# Patient Record
Sex: Male | Born: 1990 | Race: White | Hispanic: No | Marital: Single | Smoking: Current every day smoker
Health system: Southern US, Community
[De-identification: ages and names within clinical notes are randomized; demographics above are authoritative.]

## PROBLEM LIST (undated history)

## (undated) DIAGNOSIS — J939 Pneumothorax, unspecified: Secondary | ICD-10-CM

## (undated) DIAGNOSIS — R0789 Other chest pain: Secondary | ICD-10-CM

## (undated) DIAGNOSIS — G8929 Other chronic pain: Secondary | ICD-10-CM

## (undated) DIAGNOSIS — J982 Interstitial emphysema: Secondary | ICD-10-CM

---
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
## 2009-05-19 ENCOUNTER — Emergency Department (HOSPITAL_COMMUNITY): Admission: EM | Admit: 2009-05-19 | Discharge: 2009-05-19 | Payer: Self-pay | Admitting: Emergency Medicine

## 2009-08-13 DIAGNOSIS — J982 Interstitial emphysema: Secondary | ICD-10-CM

## 2009-08-13 DIAGNOSIS — J939 Pneumothorax, unspecified: Secondary | ICD-10-CM

## 2009-08-13 HISTORY — DX: Interstitial emphysema: J98.2

## 2009-08-13 HISTORY — DX: Pneumothorax, unspecified: J93.9

## 2009-09-14 ENCOUNTER — Emergency Department (HOSPITAL_COMMUNITY): Admission: EM | Admit: 2009-09-14 | Discharge: 2009-09-14 | Payer: Self-pay | Admitting: Emergency Medicine

## 2009-10-12 ENCOUNTER — Emergency Department (HOSPITAL_COMMUNITY): Admission: EM | Admit: 2009-10-12 | Discharge: 2009-10-12 | Payer: Self-pay | Admitting: Emergency Medicine

## 2010-01-19 ENCOUNTER — Encounter: Payer: Self-pay | Admitting: Emergency Medicine

## 2010-01-19 ENCOUNTER — Inpatient Hospital Stay (HOSPITAL_COMMUNITY): Admission: EM | Admit: 2010-01-19 | Discharge: 2010-01-21 | Payer: Self-pay | Admitting: Emergency Medicine

## 2010-10-30 LAB — CBC
HCT: 41.8 % (ref 39.0–52.0)
Hemoglobin: 13.7 g/dL (ref 13.0–17.0)
Hemoglobin: 13.7 g/dL (ref 13.0–17.0)
MCV: 92.9 fL (ref 78.0–100.0)
RBC: 4.29 MIL/uL (ref 4.22–5.81)
RBC: 4.31 MIL/uL (ref 4.22–5.81)
RDW: 13.2 % (ref 11.5–15.5)
WBC: 6.8 10*3/uL (ref 4.0–10.5)

## 2010-10-30 LAB — DIFFERENTIAL
Basophils Absolute: 0 10*3/uL (ref 0.0–0.1)
Basophils Relative: 0 % (ref 0–1)
Lymphs Abs: 1.1 10*3/uL (ref 0.7–4.0)
Monocytes Relative: 4 % (ref 3–12)
Neutrophils Relative %: 87 % — ABNORMAL HIGH (ref 43–77)

## 2010-10-30 LAB — BASIC METABOLIC PANEL
BUN: 6 mg/dL (ref 6–23)
CO2: 26 mEq/L (ref 19–32)
CO2: 31 mEq/L (ref 19–32)
Calcium: 9.5 mg/dL (ref 8.4–10.5)
Chloride: 109 mEq/L (ref 96–112)
Creatinine, Ser: 1.09 mg/dL (ref 0.4–1.5)
Creatinine, Ser: 0.89 mg/dL (ref 0.4–1.5)
GFR calc Af Amer: >60 mL/min (ref >60)
GFR calc Af Amer: >60 mL/min (ref >60)
GFR calc Af Amer: >60 mL/min (ref >60)
GFR calc non Af Amer: >60 mL/min (ref >60)
Glucose, Bld: 101 mg/dL — ABNORMAL HIGH (ref 70–99)
Potassium: 3.6 mEq/L (ref 3.5–5.1)
Sodium: 138 mEq/L (ref 135–145)

## 2010-10-30 LAB — GLUCOSE, CAPILLARY: Glucose-Capillary: 107 mg/dL — ABNORMAL HIGH (ref 70–99)

## 2010-11-01 LAB — RAPID STREP SCREEN (MED CTR MEBANE ONLY): Streptococcus, Group A Screen (Direct): NEGATIVE

## 2010-11-06 LAB — RAPID STREP SCREEN (MED CTR MEBANE ONLY): Streptococcus, Group A Screen (Direct): NEGATIVE

## 2013-12-08 DIAGNOSIS — F17200 Nicotine dependence, unspecified, uncomplicated: Secondary | ICD-10-CM | POA: Insufficient documentation

## 2013-12-08 DIAGNOSIS — H53149 Visual discomfort, unspecified: Secondary | ICD-10-CM | POA: Insufficient documentation

## 2013-12-08 DIAGNOSIS — H5710 Ocular pain, unspecified eye: Secondary | ICD-10-CM | POA: Insufficient documentation

## 2013-12-08 DIAGNOSIS — H5789 Other specified disorders of eye and adnexa: Secondary | ICD-10-CM | POA: Insufficient documentation

## 2013-12-09 ENCOUNTER — Encounter (HOSPITAL_COMMUNITY): Payer: Self-pay | Admitting: Emergency Medicine

## 2013-12-09 ENCOUNTER — Emergency Department (HOSPITAL_COMMUNITY)
Admission: EM | Admit: 2013-12-09 | Discharge: 2013-12-09 | Disposition: A | Discharge status: Home / Self Care | Payer: Self-pay | Attending: Emergency Medicine | Admitting: Emergency Medicine

## 2013-12-09 DIAGNOSIS — H5712 Ocular pain, left eye: Secondary | ICD-10-CM

## 2013-12-09 MED ORDER — FLUORESCEIN SODIUM 1 MG OP STRP
ORAL_STRIP | OPHTHALMIC | Status: AC
  Administered 2013-12-09: 01:00:00
  Filled 2013-12-09: qty 1

## 2013-12-09 MED ORDER — TETRACAINE HCL 0.5 % OP SOLN
OPHTHALMIC | Status: AC
  Administered 2013-12-09: 01:00:00
  Filled 2013-12-09: qty 2

## 2013-12-09 MED ORDER — ERYTHROMYCIN 5 MG/GM OP OINT
TOPICAL_OINTMENT | Freq: Once | OPHTHALMIC | Status: AC
  Administered 2013-12-09: 1 via OPHTHALMIC
  Filled 2013-12-09: qty 3.5

## 2013-12-09 NOTE — ED Provider Notes (Signed)
Medical screening examination/treatment/procedure(s) were performed by non-physician practitioner and as supervising physician I was immediately available for consultation/collaboration.    Vida RollerBrian D Douglass Dunshee, MD 12/09/13 [phone]

## 2013-12-09 NOTE — Discharge Instructions (Signed)
Apply a small ribbon (1/2 inch) of the antibiotic ointment to your left lower eyelid edge.  Blinking will distribute it across your eye and help soothe it.  It will cause blurred vision for a few minutes and is normal .

## 2013-12-09 NOTE — ED Provider Notes (Signed)
CSN: [id number]     Arrival date & time 12/08/13  2353 History   First MD Initiated Contact with Patient 12/09/13 0018     Chief Complaint  Patient presents with  . Eye Injury     (Consider location/radiation/quality/duration/timing/severity/associated sxs/prior Treatment) HPI Comments: Mark Andersen W Savin is a 23 y.o. Male presenting with a foreign body sensation of his left eye which he woke with today.  He was standing near someone sawing wood yesterday when a small piece of wood his his left eye.  He denies having pain in the eye until waking today with sensation of foreign body under his left upper eyelid along with light sensitivity.  He denies visual changes and there has been no drainage from the eye.  He has taken no medicines prior to arriva..     Patient is a 23 y.o. male presenting with eye injury. The history is provided by the patient.  Eye Injury This is a new problem. The current episode started yesterday. The problem occurs constantly. The problem has been unchanged. Pertinent negatives include no chest pain, congestion, fever, headaches, nausea, neck pain, numbness, rash, sore throat, visual change or weakness. Exacerbated by: blinking. He has tried nothing for the symptoms.    History reviewed. No pertinent past medical history. History reviewed. No pertinent past surgical history. History reviewed. No pertinent family history. History  Substance Use Topics  . Smoking status: Current Every Day Smoker -- 0.50 packs/day for 5 years    Types: Cigarettes  . Smokeless tobacco: Not on file  . Alcohol Use: No    Review of Systems  Constitutional: Negative for fever.  HENT: Negative for congestion and sore throat.   Eyes: Positive for photophobia, pain and redness. Negative for discharge and visual disturbance.  Respiratory: Negative for chest tightness and shortness of breath.   Cardiovascular: Negative for chest pain.  Gastrointestinal: Negative for nausea.   Genitourinary: Negative.   Musculoskeletal: Negative for neck pain.  Skin: Negative.  Negative for rash and wound.  Neurological: Negative for dizziness, weakness, light-headedness, numbness and headaches.  Psychiatric/Behavioral: Negative.       Allergies  Review of patient's allergies indicates no known allergies.  Home Medications   Prior to Admission medications   Not on File   BP 133/86  Pulse 85  Temp(Src) 97.5 F (36.4 C) (Oral)  Resp 20  Ht 6\' 1"  (1.854 m)  Wt 158 lb (71.668 kg)  BMI 20.85 kg/m2  SpO2 100% Physical Exam  Nursing note and vitals reviewed. Constitutional: He appears well-developed and well-nourished. No distress.  HENT:  Head: Normocephalic and atraumatic.  Eyes: EOM are normal. Pupils are equal, round, and reactive to light. Lids are everted and swept, no foreign bodies found. Left eye exhibits no chemosis and no discharge. No foreign body present in the left eye. Left conjunctiva is injected.  Slit lamp exam:      The left eye shows no corneal abrasion, no corneal flare, no corneal ulcer, no foreign body, no hyphema, no fluorescein uptake and no anterior chamber bulge.  Visual Acuity - R Near: 20/25 ; L Near: 20/25   Pt does appreciate burning type pain only at the 12 oclock position with consensual light stimulus to right eye.          Neck: Normal range of motion.  Cardiovascular: Normal rate, regular rhythm, normal heart sounds and intact distal pulses.   Pulmonary/Chest: Effort normal and breath sounds normal. He has no wheezes.  Abdominal: Soft.  Bowel sounds are normal. There is no tenderness.  Musculoskeletal: Normal range of motion.  Neurological: He is alert.  Skin: Skin is warm and dry.  Psychiatric: He has a normal mood and affect.    ED Course  Procedures (including critical care time) Labs Review Labs Reviewed - No data to display  Imaging Review No results found.   EKG Interpretation None      MDM   Final  diagnoses:  Pain, eye, left    Pt discussed with Dr. Hyacinth MeekerMiller prior to dc home.  Pt without exam findings to explain his pain tonight.  His eye was flushed with NS and he had no improvement in sx.  He was referred to Dr Lita MainsHaines for further evaluation tomorrow.  In the interim,  Applied and gave tobrex ointment for abx coverage and for tx of discomfort.  Burgess AmorJulie Xavian Hardcastle, PA-C 12/09/13 0105

## 2013-12-09 NOTE — ED Notes (Signed)
F.B sensation after being near a saw cutting wood

## 2014-07-22 ENCOUNTER — Encounter (HOSPITAL_COMMUNITY): Payer: Self-pay | Admitting: Emergency Medicine

## 2014-07-22 ENCOUNTER — Emergency Department (HOSPITAL_COMMUNITY): Payer: Self-pay

## 2014-07-22 ENCOUNTER — Emergency Department (HOSPITAL_COMMUNITY)
Admission: EM | Admit: 2014-07-22 | Discharge: 2014-07-22 | Disposition: A | Discharge status: Home / Self Care | Payer: Self-pay | Attending: Emergency Medicine | Admitting: Emergency Medicine

## 2014-07-22 DIAGNOSIS — Z8709 Personal history of other diseases of the respiratory system: Secondary | ICD-10-CM | POA: Insufficient documentation

## 2014-07-22 DIAGNOSIS — G8929 Other chronic pain: Secondary | ICD-10-CM | POA: Insufficient documentation

## 2014-07-22 DIAGNOSIS — Z720 Tobacco use: Secondary | ICD-10-CM | POA: Insufficient documentation

## 2014-07-22 DIAGNOSIS — R0789 Other chest pain: Secondary | ICD-10-CM | POA: Insufficient documentation

## 2014-07-22 DIAGNOSIS — R52 Pain, unspecified: Secondary | ICD-10-CM

## 2014-07-22 HISTORY — DX: Other chronic pain: G89.29

## 2014-07-22 HISTORY — DX: Interstitial emphysema: J98.2

## 2014-07-22 HISTORY — DX: Other chest pain: R07.89

## 2014-07-22 HISTORY — DX: Pneumothorax, unspecified: J93.9

## 2014-07-22 LAB — CBC WITH DIFFERENTIAL/PLATELET
BASOS PCT: 0 % (ref 0–1)
Basophils Absolute: 0 10*3/uL (ref 0.0–0.1)
Eosinophils Absolute: 0.1 10*3/uL (ref 0.0–0.7)
Eosinophils Relative: 1 % (ref 0–5)
HEMATOCRIT: 40.5 % (ref 39.0–52.0)
Hemoglobin: 14.5 g/dL (ref 13.0–17.0)
LYMPHS PCT: 25 % (ref 12–46)
Lymphs Abs: 1.4 10*3/uL (ref 0.7–4.0)
MCH: 32.1 pg (ref 26.0–34.0)
MCHC: 35.8 g/dL (ref 30.0–36.0)
MCV: 89.6 fL (ref 78.0–100.0)
MONO ABS: 0.4 10*3/uL (ref 0.1–1.0)
Monocytes Relative: 7 % (ref 3–12)
NEUTROS ABS: 3.8 10*3/uL (ref 1.7–7.7)
NEUTROS PCT: 67 % (ref 43–77)
Platelets: 188 10*3/uL (ref 150–400)
RBC: 4.52 MIL/uL (ref 4.22–5.81)
RDW: 12.2 % (ref 11.5–15.5)
WBC: 5.7 10*3/uL (ref 4.0–10.5)

## 2014-07-22 LAB — BASIC METABOLIC PANEL
ANION GAP: 11 (ref 5–15)
BUN: 11 mg/dL (ref 6–23)
CHLORIDE: 100 meq/L (ref 96–112)
CO2: 29 meq/L (ref 19–32)
CREATININE: 1.04 mg/dL (ref 0.50–1.35)
Calcium: 9.7 mg/dL (ref 8.4–10.5)
GFR calc non Af Amer: >90 mL/min (ref >90)
Glucose, Bld: 98 mg/dL (ref 70–99)
POTASSIUM: 4.7 meq/L (ref 3.7–5.3)
Sodium: 140 mEq/L (ref 137–147)

## 2014-07-22 LAB — D-DIMER, QUANTITATIVE (NOT AT ARMC)

## 2014-07-22 LAB — TROPONIN I: Troponin I: <0.3 ng/mL (ref <0.30)

## 2014-07-22 LAB — PRO B NATRIURETIC PEPTIDE: PRO B NATRI PEPTIDE: 23.5 pg/mL (ref 0–125)

## 2014-07-22 MED ORDER — IBUPROFEN 800 MG PO TABS: 800.0000 mg | ORAL_TABLET | Freq: Three times a day (TID) | ORAL | Status: DC

## 2014-07-22 MED ORDER — TRAMADOL HCL 50 MG PO TABS: 50.0000 mg | ORAL_TABLET | Freq: Four times a day (QID) | ORAL | Status: DC | PRN

## 2014-07-22 NOTE — ED Provider Notes (Signed)
CSN: [id number]     Arrival date & time 07/22/14  1556 History   First MD Initiated Contact with Patient 07/22/14 1619     Chief Complaint  Patient presents with  . Pleurisy     (Consider location/radiation/quality/duration/timing/severity/associated sxs/prior Treatment) HPI Comments: Patient presents to the ER for evaluation of intermittent episodes of chest pain and/or palpitations over the last 2 or 3 weeks. Patient reports that he has been experiencing intermittent episodes of feeling like his heart is fluttering and it makes him feel like he can't catch his breath. He has now developed a sharp pain that feels like "a needle" to the left side of his sternum that occurs intermittently. This seems to worsen if he presses on the area or makes certain movements with his torso. Patient does report that he had a history of pneumothorax 5 years ago and has had some episodes of chronic pain ever since.   Past Medical History  Diagnosis Date  . Chronic chest wall pain   . Pneumothorax 2011    bilateral  . Pneumomediastinum 2011   History reviewed. No pertinent past surgical history. No family history on file. History  Substance Use Topics  . Smoking status: Current Every Day Smoker -- 0.50 packs/day for 5 years    Types: Cigarettes  . Smokeless tobacco: Not on file  . Alcohol Use: No    Review of Systems  Respiratory: Positive for shortness of breath.   Cardiovascular: Positive for chest pain.  All other systems reviewed and are negative.     Allergies  Review of patient's allergies indicates no known allergies.  Home Medications   Prior to Admission medications   Not on File   BP 124/66 mmHg  Pulse 73  Temp(Src) 98.8 F (37.1 C)  Resp 14  Ht 6\' 1"  (1.854 m)  Wt 158 lb (71.668 kg)  BMI 20.85 kg/m2  SpO2 98% Physical Exam  Constitutional: He is oriented to person, place, and time. He appears well-developed and well-nourished. No distress.  HENT:  Head:  Normocephalic and atraumatic.  Right Ear: Hearing normal.  Left Ear: Hearing normal.  Nose: Nose normal.  Mouth/Throat: Oropharynx is clear and moist and mucous membranes are normal.  Eyes: Conjunctivae and EOM are normal. Pupils are equal, round, and reactive to light.  Neck: Normal range of motion. Neck supple.  Cardiovascular: Regular rhythm, S1 normal and S2 normal.  Exam reveals no gallop and no friction rub.   No murmur heard. Pulmonary/Chest: Effort normal and breath sounds normal. No respiratory distress. He exhibits tenderness.    Abdominal: Soft. Normal appearance and bowel sounds are normal. There is no hepatosplenomegaly. There is no tenderness. There is no rebound, no guarding, no tenderness at McBurney's point and negative Murphy's sign. No hernia.  Musculoskeletal: Normal range of motion.  Neurological: He is alert and oriented to person, place, and time. He has normal strength. No cranial nerve deficit or sensory deficit. Coordination normal. GCS eye subscore is 4. GCS verbal subscore is 5. GCS motor subscore is 6.  Skin: Skin is warm, dry and intact. No rash noted. No cyanosis.  Psychiatric: He has a normal mood and affect. His speech is normal and behavior is normal. Thought content normal.  Nursing note and vitals reviewed.   ED Course  Procedures (including critical care time) Labs Review Labs Reviewed  CBC WITH DIFFERENTIAL  BASIC METABOLIC PANEL  TROPONIN I  PRO B NATRIURETIC PEPTIDE  D-DIMER, QUANTITATIVE    Imaging Review Dg  Chest 2 View  07/22/2014   CLINICAL DATA:  Midsternal chest pain. Pain with movement and positional change. Chronic symptoms following pneumothorax 5 years ago. Recent aggravation of symptoms.  EXAM: CHEST  2 VIEW  COMPARISON:  01/21/2010.  FINDINGS: There is no pneumothorax or pneumomediastinum. Bilateral pleural apical thickening is present compatible with scarring. Monitoring leads project over the chest. The cardiopericardial  silhouette is within normal limits. There is no airspace disease or effusion. The LEFT hemithorax is hyperlucent compared to the RIGHT. This is probably technical.  IMPRESSION: No active cardiopulmonary disease.   Electronically Signed   By: Andreas NewportGeoffrey  Lamke M.D.   On: 07/22/2014 16:37     EKG Interpretation None      MDM   Final diagnoses:  Pain  chest wall pain  Patient presents to the ER for evaluation of sharp pain to the left of the sternum. Region is tender and there is also worsening of pain with movements of the torso. This is consistent with musculoskeletal chest pain. Patient does have a history of spontaneous pneumothorax. X-ray today does not suggest pneumothorax. Lab work was otherwise unremarkable. D-dimer was also negative, very little concern for PE. Patient was reassured, likely musculoskeletal and/or inflammatory. With his history of pneumothorax, will have patient return if his symptoms worsen, as I cannot rule out very small pneumothorax not seen on x-ray, but symptoms are atypical and I do not think the patient requires a CT at this time.    Gilda Creasehristopher J. Pollina, MD 07/22/14 [phone]

## 2014-07-22 NOTE — Discharge Instructions (Signed)

## 2014-07-22 NOTE — ED Notes (Addendum)
Pt c/o midsternal c/p worse with movement and position change. States this has been a chronic issue since having a pneumothorax 5 years ago and has recently become more frequent.

## 2018-01-14 ENCOUNTER — Emergency Department (HOSPITAL_COMMUNITY)
Admission: EM | Admit: 2018-01-14 | Discharge: 2018-01-14 | Disposition: A | Discharge status: Home / Self Care | Payer: BLUE CROSS/BLUE SHIELD | Attending: Emergency Medicine | Admitting: Emergency Medicine

## 2018-01-14 ENCOUNTER — Other Ambulatory Visit: Payer: Self-pay

## 2018-01-14 ENCOUNTER — Encounter (HOSPITAL_COMMUNITY): Payer: Self-pay

## 2018-01-14 ENCOUNTER — Emergency Department (HOSPITAL_COMMUNITY): Payer: BLUE CROSS/BLUE SHIELD

## 2018-01-14 DIAGNOSIS — F17210 Nicotine dependence, cigarettes, uncomplicated: Secondary | ICD-10-CM | POA: Diagnosis not present

## 2018-01-14 DIAGNOSIS — Y998 Other external cause status: Secondary | ICD-10-CM | POA: Diagnosis not present

## 2018-01-14 DIAGNOSIS — S6992XA Unspecified injury of left wrist, hand and finger(s), initial encounter: Secondary | ICD-10-CM | POA: Diagnosis present

## 2018-01-14 DIAGNOSIS — Y9389 Activity, other specified: Secondary | ICD-10-CM | POA: Insufficient documentation

## 2018-01-14 DIAGNOSIS — S62317A Displaced fracture of base of fifth metacarpal bone. left hand, initial encounter for closed fracture: Secondary | ICD-10-CM | POA: Diagnosis not present

## 2018-01-14 DIAGNOSIS — W2209XA Striking against other stationary object, initial encounter: Secondary | ICD-10-CM | POA: Insufficient documentation

## 2018-01-14 DIAGNOSIS — Y929 Unspecified place or not applicable: Secondary | ICD-10-CM | POA: Diagnosis not present

## 2018-01-14 DIAGNOSIS — Z23 Encounter for immunization: Secondary | ICD-10-CM | POA: Insufficient documentation

## 2018-01-14 MED ORDER — BACITRACIN ZINC 500 UNIT/GM EX OINT
1.0000 "application " | TOPICAL_OINTMENT | Freq: Two times a day (BID) | CUTANEOUS | Status: DC
  Administered 2018-01-14: 1 via TOPICAL
  Filled 2018-01-14 (×2): qty 0.9

## 2018-01-14 MED ORDER — IBUPROFEN 800 MG PO TABS
800.0000 mg | ORAL_TABLET | Freq: Once | ORAL | Status: AC
Start: 2018-01-14 — End: 2018-01-14
  Administered 2018-01-14: 800 mg via ORAL
  Filled 2018-01-14: qty 1

## 2018-01-14 MED ORDER — TETANUS-DIPHTH-ACELL PERTUSSIS 5-2.5-18.5 LF-MCG/0.5 IM SUSP
0.5000 mL | Freq: Once | INTRAMUSCULAR | Status: AC
  Administered 2018-01-14: 0.5 mL via INTRAMUSCULAR
  Filled 2018-01-14: qty 0.5

## 2018-01-14 MED ORDER — IBUPROFEN 800 MG PO TABS: 800.0000 mg | ORAL_TABLET | Freq: Three times a day (TID) | ORAL | 0 refills | Status: AC

## 2018-01-14 NOTE — ED Provider Notes (Signed)
[facility] EMERGENCY DEPARTMENT Provider Note   CSN: [id number] Arrival date & time: 01/14/18  1004     History   Chief Complaint Chief Complaint  Patient presents with  . Hand Injury    HPI Mark Andersen is a 27 y.o. male.  HPI  The patient is a 27 year old male, he has a history of striking a tree with a closed fist of his left hand just prior to arrival when he became upset.  He had acute onset of pain and deformity of the left lateral hand.  He is right-hand dominant.  This was acute in onset, pain is persistent, worse with range of motion, associated with mild swelling but no numbness.  Past Medical History:  Diagnosis Date  . Chronic chest wall pain   . Pneumomediastinum (HCC) 2011  . Pneumothorax 2011   bilateral    There are no active problems to display for this patient.   History reviewed. No pertinent surgical history.      Home Medications    Prior to Admission medications   Medication Sig Start Date End Date Taking? Authorizing Provider  ibuprofen (ADVIL,MOTRIN) 800 MG tablet Take 1 tablet (800 mg total) by mouth 3 (three) times daily. 01/14/18   Eber Hong, MD    Family History No family history on file.  Social History Social History   Tobacco Use  . Smoking status: Current Every Day Smoker    Packs/day: 0.50    Years: 5.00    Pack years: 2.50    Types: Cigarettes  Substance Use Topics  . Alcohol use: No  . Drug use: Not on file     Allergies   Patient has no known allergies.   Review of Systems Review of Systems  Musculoskeletal: Positive for joint swelling.  Neurological: Negative for weakness.     Physical Exam Updated Vital Signs BP 126/78 (BP Location: Right Arm)   Pulse 63   Temp 98.2 F (36.8 C) (Oral)   Resp 18   Ht 6\' 2"  (1.88 m)   Wt 71.7 kg (158 lb)   SpO2 99%   BMI 20.29 kg/m   Physical Exam  Constitutional: He appears well-developed and well-nourished.  HENT:  Head: Normocephalic and atraumatic.    Eyes: Conjunctivae are normal. Right eye exhibits no discharge. Left eye exhibits no discharge.  Pulmonary/Chest: Effort normal. No respiratory distress.  Musculoskeletal: He exhibits tenderness and deformity.  Has ttp in the lateral left proximal fifth metacarpal.  Normal range of motion of the wrist, the patient is able to completely flex the hand though with some pain.  There is some abrasions over the knuckles  Neurological: He is alert. Coordination normal.  Normal strength and sensation of the hand  Skin: Skin is warm and dry. No rash noted. He is not diaphoretic. No erythema.  Psychiatric: He has a normal mood and affect.  Nursing note and vitals reviewed.    ED Treatments / Results  Labs (all labs ordered are listed, but only abnormal results are displayed) Labs Reviewed - No data to display  EKG None  Radiology Dg Hand Complete Left  Result Date: 01/14/2018 CLINICAL DATA:  Punched a tree this morning, pain and swelling EXAM: LEFT HAND - COMPLETE 3+ VIEW COMPARISON:  None FINDINGS: Osseous mineralization normal. Joint spaces preserved. Displaced fracture at base of LEFT fifth metacarpal. No definite intra-articular extension. Mild overlying soft tissue swelling. No additional fracture, dislocation, or bone destruction. IMPRESSION: Mildly displaced fracture at base of LEFT  fifth metacarpal. Electronically Signed   By: Ulyses SouthwardMark  Boles M.D.   On: 01/14/2018 10:32    Procedures Procedures (including critical care time)  Medications Ordered in ED Medications  Tdap (BOOSTRIX) injection 0.5 mL (has no administration in time range)  bacitracin ointment 1 application (has no administration in time range)     Initial Impression / Assessment and Plan / ED Course  I have reviewed the triage vital signs and the nursing notes.  Pertinent labs & imaging results that were available during my care of the patient were reviewed by me and considered in my medical decision making (see chart  for details).    I have personally looked at the x-ray, I believe that there is a proximal fifth metacarpal fracture.  It is minimally displaced, there is no signs of open fracture, he does have some abrasions, he does not know when his last tetanus shot was.  He will be placed in a ulnar gutter splint, I have arranged follow-up for orthopedics with his outpatient follow-up phone number which the patient can call to make an appointment.  He will be given some anti-inflammatories for pain, antibiotic ointment for his wounds, but this is not an open fracture, the patient is agreeable to the plan.  Final Clinical Impressions(s) / ED Diagnoses   Final diagnoses:  Closed displaced fracture of base of fifth metacarpal bone of left hand, initial encounter    ED Discharge Orders        Ordered    ibuprofen (ADVIL,MOTRIN) 800 MG tablet  3 times daily     01/14/18 1055       Eber HongMiller, Sherrel Shafer, MD 01/14/18 1055

## 2018-01-14 NOTE — Discharge Instructions (Signed)
Call for an appointment today.  You should be seen within the next week.  Please wear your splint until you are seen in follow-up.  You will not be able to use her hand until you are cleared by the orthopedic surgeon.  Take high-dose ibuprofen for pain and swelling, please ice and elevate your hand.

## 2018-01-14 NOTE — ED Triage Notes (Signed)
30-45 mins ago pt was upset and hit a tree with his left hand. Slight deformity noted to lateral part of hand and cut noted.

## 2018-01-16 ENCOUNTER — Encounter: Payer: Self-pay | Admitting: Orthopedic Surgery

## 2018-01-16 ENCOUNTER — Ambulatory Visit (INDEPENDENT_AMBULATORY_CARE_PROVIDER_SITE_OTHER): Payer: BLUE CROSS/BLUE SHIELD | Admitting: Orthopedic Surgery

## 2018-01-16 VITALS — BP 129/74 | HR 88 | Ht 74.0 in | Wt 150.0 lb

## 2018-01-16 DIAGNOSIS — S63055A Dislocation of other carpometacarpal joint of left hand, initial encounter: Secondary | ICD-10-CM | POA: Diagnosis not present

## 2018-01-16 NOTE — Patient Instructions (Signed)
NO WORK 6 WEEKS

## 2018-01-17 NOTE — Progress Notes (Signed)
  NEW PATIENT OFFICE VISIT   Chief Complaint  Patient presents with  . Hand Pain    [facility]     MEDICAL DECISION SECTION  xrays ordered? no  My independent reading of xrays: X-rays from the hospital show a [facility] with digit base   Encounter Diagnosis  Name Primary?  . CMC (carpometacarpal joint) dislocation, left, initial encounter, 5TH DIGIT  Yes     PLAN:  Short arm cast No work for 6 weeks  x-ray in 6 weeks   INJECTION  n0 No orders of the defined types were placed in this encounter.   FURTHER WORK UP PLANNED no     Chief Complaint  Patient presents with  . Hand Pain    [facility]    27 year old male presents with a right hand fracture  He says he fell became twisted somehow twisted and injured his right hand.  He complains of painful right hand for 2 days nonradiating dull in quality associated with swelling and difficulty bending his fingers  He says that he pulled it back into place when he hurt it   Review of Systems  Genitourinary: Negative.   Musculoskeletal: Negative.   Neurological: Negative.      Past Medical History:  Diagnosis Date  . Chronic chest wall pain   . Pneumomediastinum (HCC) 2011  . Pneumothorax 2011   bilateral    History reviewed. No pertinent surgical history.  Family History  Problem Relation Age of Onset  . Healthy Mother   . Diabetes Father    Social History   Tobacco Use  . Smoking status: Current Every Day Smoker    Packs/day: 0.50    Years: 5.00    Pack years: 2.50    Types: Cigarettes  . Smokeless tobacco: Never Used  Substance Use Topics  . Alcohol use: No  . Drug use: Not on file    @ALL @  No outpatient medications have been marked as taking for the 01/16/18 encounter (Office Visit) with Vickki HearingHarrison, Donnivan Villena E, MD.    BP 129/74   Pulse 88   Ht 6\' 2"  (1.88 m)   Wt 150 lb (68 kg)   BMI 19.26 kg/m   Physical Exam  Constitutional: He  is oriented to person, place, and time. He appears well-developed and well-nourished.  Vital signs have been reviewed and are stable. Gen. appearance the patient is well-developed and well-nourished with normal grooming and hygiene.   Neurological: He is alert and oriented to person, place, and time.  Skin: Skin is warm and dry. No erythema.  Psychiatric: He has a normal mood and affect.  Vitals reviewed.   Ortho Exam  Right hand skin some abrasions none near the fracture site primarily over the fingers.  He is tender at the [facility] at the base of the fifth metacarpal there is swelling there without deformity.  He has decreased range range of motion of the small ring and long finger thumb and index normal.  There does appear to be some subluxation of the [facility] joint but there is no dorsal dislocation.  He has decreased grip strength in his hand secondary to pain normal sensation is noted in all digits color and capillary refill normal with normal pulse and perfusion  His left hand for comparison no malalignment normal range of motion normal strength normal pulse normal sensation  Fuller CanadaStanley Maryiah Olvey, MD  01/17/2018 9:09 AM

## 2018-01-23 ENCOUNTER — Telehealth: Payer: Self-pay | Admitting: Orthopedic Surgery

## 2018-01-23 NOTE — Telephone Encounter (Signed)
Palmar edge of the cast was trimmed and moleskin placed, since cast was deficient in padding in the area. He was happy with cast repair and will let me know if he has any further problems.  To you FYI

## 2018-01-23 NOTE — Telephone Encounter (Signed)
Patient stopped by office primarily to ask about forms process for his FMLA and short-term disability paperwork for which we have advised patient; he then mentioned having gotten his cast a little wet. Forwarding note to clinical staff as no providers in office this afternoon.

## 2018-02-10 ENCOUNTER — Ambulatory Visit (INDEPENDENT_AMBULATORY_CARE_PROVIDER_SITE_OTHER): Payer: BLUE CROSS/BLUE SHIELD

## 2018-02-10 ENCOUNTER — Encounter: Payer: Self-pay | Admitting: Orthopedic Surgery

## 2018-02-10 ENCOUNTER — Ambulatory Visit (INDEPENDENT_AMBULATORY_CARE_PROVIDER_SITE_OTHER): Payer: BLUE CROSS/BLUE SHIELD | Admitting: Orthopedic Surgery

## 2018-02-10 VITALS — BP 117/69 | HR 80 | Ht 74.0 in | Wt 152.0 lb

## 2018-02-10 DIAGNOSIS — S62317D Displaced fracture of base of fifth metacarpal bone. left hand, subsequent encounter for fracture with routine healing: Secondary | ICD-10-CM

## 2018-02-10 NOTE — Patient Instructions (Signed)
Wear brace 3 to 4 weeks okay to take off for bathing sleeping

## 2018-02-10 NOTE — Progress Notes (Signed)
Fracture care follow-up  Chief Complaint  Patient presents with  . Cast Problem    cast wet, has pulled all padding out of cast   . Hand Injury    [facility]  Date of injury 01/14/18    Encounter Diagnosis  Name Primary?  . Displaced fracture of base of fifth metacarpal bone, left hand, subsequent encounter for fracture with routine healing Yes     Current Outpatient Medications:  .  ibuprofen (ADVIL,MOTRIN) 800 MG tablet, Take 1 tablet (800 mg total) by mouth 3 (three) times daily. (Patient not taking: Reported on 01/16/2018), Disp: 21 tablet, Rfl: 0   He got the cast wet he took all the padding out we brought him in to have the cast removed and reexamine  BP 117/69   Pulse 80   Ht 6\' 2"  (1.88 m)   Wt 152 lb (68.9 kg)   BMI 19.52 kg/m   Physical Exam No tenderness at the fracture site full range of motion of the hand    Xrays: Fracture looks like it is healing appropriately  Plan   Splint for 4 weeks follow-up as needed and I will need an x-ray again

## 2018-02-28 ENCOUNTER — Ambulatory Visit: Payer: BLUE CROSS/BLUE SHIELD | Admitting: Orthopedic Surgery

## 2018-10-11 ENCOUNTER — Encounter (HOSPITAL_COMMUNITY): Payer: Self-pay | Admitting: Emergency Medicine

## 2018-10-11 ENCOUNTER — Emergency Department (HOSPITAL_COMMUNITY): Payer: Self-pay

## 2018-10-11 ENCOUNTER — Other Ambulatory Visit: Payer: Self-pay

## 2018-10-11 ENCOUNTER — Emergency Department (HOSPITAL_COMMUNITY)
Admission: EM | Admit: 2018-10-11 | Discharge: 2018-10-11 | Disposition: A | Discharge status: Home / Self Care | Payer: Self-pay | Attending: Emergency Medicine | Admitting: Emergency Medicine

## 2018-10-11 DIAGNOSIS — F17210 Nicotine dependence, cigarettes, uncomplicated: Secondary | ICD-10-CM | POA: Insufficient documentation

## 2018-10-11 DIAGNOSIS — Z79899 Other long term (current) drug therapy: Secondary | ICD-10-CM | POA: Insufficient documentation

## 2018-10-11 DIAGNOSIS — K529 Noninfective gastroenteritis and colitis, unspecified: Secondary | ICD-10-CM

## 2018-10-11 LAB — URINALYSIS, ROUTINE W REFLEX MICROSCOPIC
Bacteria, UA: NONE SEEN
Bilirubin Urine: NEGATIVE
Glucose, UA: NEGATIVE mg/dL
Ketones, ur: NEGATIVE mg/dL
Leukocytes,Ua: NEGATIVE
Nitrite: NEGATIVE
Protein, ur: 30 mg/dL — AB
Specific Gravity, Urine: 1.033 — ABNORMAL HIGH (ref 1.005–1.030)
pH: 5 (ref 5.0–8.0)

## 2018-10-11 LAB — COMPREHENSIVE METABOLIC PANEL
ALT: 18 U/L (ref 0–44)
AST: 21 U/L (ref 15–41)
Albumin: 4.4 g/dL (ref 3.5–5.0)
Alkaline Phosphatase: 76 U/L (ref 38–126)
Anion gap: 11 (ref 5–15)
BUN: 17 mg/dL (ref 6–20)
CO2: 22 mmol/L (ref 22–32)
Calcium: 9 mg/dL (ref 8.9–10.3)
Chloride: 103 mmol/L (ref 98–111)
Creatinine, Ser: 1.08 mg/dL (ref 0.61–1.24)
GFR calc Af Amer: >60 mL/min (ref >60)
GFR calc non Af Amer: >60 mL/min (ref >60)
Glucose, Bld: 113 mg/dL — ABNORMAL HIGH (ref 70–99)
Potassium: 4 mmol/L (ref 3.5–5.1)
Sodium: 136 mmol/L (ref 135–145)
Total Bilirubin: 0.4 mg/dL (ref 0.3–1.2)
Total Protein: 7.7 g/dL (ref 6.5–8.1)

## 2018-10-11 LAB — CBC
HCT: 48.5 % (ref 39.0–52.0)
Hemoglobin: 16.6 g/dL (ref 13.0–17.0)
MCH: 31.8 pg (ref 26.0–34.0)
MCHC: 34.2 g/dL (ref 30.0–36.0)
MCV: 92.9 fL (ref 80.0–100.0)
Platelets: 210 10*3/uL (ref 150–400)
RBC: 5.22 MIL/uL (ref 4.22–5.81)
RDW: 12 % (ref 11.5–15.5)
WBC: 12.7 10*3/uL — ABNORMAL HIGH (ref 4.0–10.5)
nRBC: 0 % (ref 0.0–0.2)

## 2018-10-11 LAB — LIPASE, BLOOD: Lipase: 25 U/L (ref 11–51)

## 2018-10-11 MED ORDER — SODIUM CHLORIDE 0.9% FLUSH: 3.0000 mL | Freq: Once | INTRAVENOUS | Status: DC

## 2018-10-11 MED ORDER — ONDANSETRON HCL 4 MG/2ML IJ SOLN: 4.0000 mg | Freq: Once | INTRAMUSCULAR | Status: DC | PRN

## 2018-10-11 MED ORDER — PANTOPRAZOLE SODIUM 40 MG IV SOLR
40.0000 mg | Freq: Once | INTRAVENOUS | Status: AC
  Administered 2018-10-11: 40 mg via INTRAVENOUS
  Filled 2018-10-11: qty 40

## 2018-10-11 MED ORDER — MORPHINE SULFATE (PF) 4 MG/ML IV SOLN
6.0000 mg | Freq: Once | INTRAVENOUS | Status: AC
  Administered 2018-10-11: 4 mg via INTRAVENOUS
  Filled 2018-10-11: qty 2

## 2018-10-11 MED ORDER — HYDROMORPHONE HCL 1 MG/ML IJ SOLN
1.0000 mg | Freq: Once | INTRAMUSCULAR | Status: AC
  Administered 2018-10-11: 1 mg via INTRAVENOUS
  Filled 2018-10-11: qty 1

## 2018-10-11 MED ORDER — IOHEXOL 300 MG/ML  SOLN
100.0000 mL | Freq: Once | INTRAMUSCULAR | Status: AC | PRN
  Administered 2018-10-11: 100 mL via INTRAVENOUS

## 2018-10-11 MED ORDER — SODIUM CHLORIDE 0.9 % IV BOLUS
1000.0000 mL | Freq: Once | INTRAVENOUS | Status: AC
  Administered 2018-10-11: 1000 mL via INTRAVENOUS

## 2018-10-11 MED ORDER — ONDANSETRON HCL 4 MG PO TABS: 4.0000 mg | ORAL_TABLET | Freq: Four times a day (QID) | ORAL | 0 refills | Status: AC

## 2018-10-11 MED ORDER — HALOPERIDOL LACTATE 5 MG/ML IJ SOLN
2.0000 mg | Freq: Once | INTRAMUSCULAR | Status: AC
  Administered 2018-10-11: 2 mg via INTRAVENOUS
  Filled 2018-10-11: qty 1

## 2018-10-11 MED ORDER — DICYCLOMINE HCL 20 MG PO TABS: 20.0000 mg | ORAL_TABLET | Freq: Four times a day (QID) | ORAL | 0 refills | Status: AC | PRN

## 2018-10-11 MED ORDER — ONDANSETRON HCL 4 MG/2ML IJ SOLN
4.0000 mg | Freq: Once | INTRAMUSCULAR | Status: AC
  Administered 2018-10-11: 4 mg via INTRAVENOUS
  Filled 2018-10-11: qty 2

## 2018-10-11 NOTE — ED Triage Notes (Signed)
Patient c/o generalized abd pain x2 days with nausea, vomiting, diarrhea, and fevers. Denies any urinary symptoms. Unsure of any blood in emesis or stools.

## 2018-10-11 NOTE — ED Notes (Signed)
Patient transported to CT 

## 2018-10-18 NOTE — ED Provider Notes (Signed)
[facility] EMERGENCY DEPARTMENT Provider Note   CSN: [id number] Arrival date & time: 10/11/18  1055    History   Chief Complaint Chief Complaint  Patient presents with  . Abdominal Pain    HPI Mark Andersen is a 28 y.o. male.     HPI  28 year old male with generalized abdominal pain for the past 2 days.  Associate with nausea, vomiting and diarrhea.  Subjective fevers.  No blood in stool or emesis.  Denies any sick contacts.  Abdominal pain is diffuse.  Does not lateralize.  No fevers or chills.  Denies any significant history of recurrent abdominal issues.  No prior abdominal surgery.  Past Medical History:  Diagnosis Date  . Chronic chest wall pain   . Pneumomediastinum (HCC) 2011  . Pneumothorax 2011   bilateral    There are no active problems to display for this patient.   History reviewed. No pertinent surgical history.      Home Medications    Prior to Admission medications   Medication Sig Start Date End Date Taking? Authorizing Provider  bismuth subsalicylate (PEPTO BISMOL) 262 MG/15ML suspension Take 60 mLs by mouth every 6 (six) hours as needed for indigestion.   Yes [provider]  ibuprofen (ADVIL,MOTRIN) 800 MG tablet Take 1 tablet (800 mg total) by mouth 3 (three) times daily. Patient taking differently: Take 200 mg by mouth 3 (three) times daily.  01/14/18  Yes Eber Hong, MD  Multiple Vitamin (ONE-A-DAY ESSENTIAL PO) Take 1 Dose by mouth daily.   Yes [provider]  dicyclomine (BENTYL) 20 MG tablet Take 1 tablet (20 mg total) by mouth every 6 (six) hours as needed for spasms. 10/11/18   Raeford Razor, MD  ondansetron (ZOFRAN) 4 MG tablet Take 1 tablet (4 mg total) by mouth every 6 (six) hours. 10/11/18   Raeford Razor, MD    Family History Family History  Problem Relation Age of Onset  . Healthy Mother   . Diabetes Father     Social History Social History   Tobacco Use  . Smoking status: Current Every Day Smoker    Packs/day: 0.50    Years: 5.00    Pack years: 2.50    Types: Cigarettes  . Smokeless tobacco: Never Used  Substance Use Topics  . Alcohol use: Yes    Alcohol/week: 10.0 standard drinks    Types: 10 Cans of beer per week  . Drug use: Never     Allergies   Patient has no known allergies.   Review of Systems Review of Systems  All systems reviewed and negative, other than as noted in HPI.  Physical Exam Updated Vital Signs BP 129/81   Pulse 69   Temp 99.1 F (37.3 C) (Oral)   Resp 16   Ht 6\' 1"  (1.854 m)   Wt 72.6 kg   SpO2 100%   BMI 21.11 kg/m   Physical Exam Vitals signs and nursing note reviewed.  Constitutional:      General: He is not in acute distress.    Appearance: He is well-developed.  HENT:     Head: Normocephalic and atraumatic.  Eyes:     General:        Right eye: No discharge.        Left eye: No discharge.     Conjunctiva/sclera: Conjunctivae normal.  Neck:     Musculoskeletal: Neck supple.  Cardiovascular:     Rate and Rhythm: Regular rhythm. Tachycardia present.  Heart sounds: Normal heart sounds. No murmur. No friction rub. No gallop.   Pulmonary:     Effort: Pulmonary effort is normal. No respiratory distress.     Breath sounds: Normal breath sounds.  Abdominal:     General: There is no distension.     Palpations: Abdomen is soft.     Tenderness: There is abdominal tenderness.     Comments: Mild diffuse tenderness without rebound or guarding.  No distention.  Musculoskeletal:        General: No tenderness.  Skin:    General: Skin is warm and dry.  Neurological:     Mental Status: He is alert.  Psychiatric:        Behavior: Behavior normal.        Thought Content: Thought content normal.      ED Treatments / Results  Labs (all labs ordered are listed, but only abnormal results are displayed) Labs Reviewed  COMPREHENSIVE METABOLIC PANEL - Abnormal; Notable for the following components:      Result Value   Glucose, Bld  113 (*)    All other components within normal limits  CBC - Abnormal; Notable for the following components:   WBC 12.7 (*)    All other components within normal limits  URINALYSIS, ROUTINE W REFLEX MICROSCOPIC - Abnormal; Notable for the following components:   Specific Gravity, Urine 1.033 (*)    Hgb urine dipstick MODERATE (*)    Protein, ur 30 (*)    All other components within normal limits  LIPASE, BLOOD    EKG None  Radiology No results found.   Ct Abdomen Pelvis W Contrast  Result Date: 10/11/2018 CLINICAL DATA:  Acute abdominal pain.  Pain for 2 days with nausea. EXAM: CT ABDOMEN AND PELVIS WITH CONTRAST TECHNIQUE: Multidetector CT imaging of the abdomen and pelvis was performed using the standard protocol following bolus administration of intravenous contrast. CONTRAST:  100mL OMNIPAQUE IOHEXOL 300 MG/ML  SOLN COMPARISON:  01/19/2010 FINDINGS: Lower chest: No acute abnormality. Hepatobiliary: No focal liver abnormality is seen. No gallstones, gallbladder wall thickening, or biliary dilatation. Pancreas: Unremarkable. No pancreatic ductal dilatation or surrounding inflammatory changes. Spleen: Normal in size without focal abnormality. Adrenals/Urinary Tract: Adrenal glands are unremarkable. Kidneys are normal, without renal calculi, focal lesion, or hydronephrosis. Bladder is unremarkable. Stomach/Bowel: Stomach is within normal limits. Appendix appears normal. No evidence of bowel wall thickening, distention, or inflammatory changes. Fluid-filled nondistended loops of small bowel and colon as can be seen with enterocolitis. Vascular/Lymphatic: No significant vascular findings are present. No enlarged abdominal or pelvic lymph nodes. Reproductive: Prostate is unremarkable. Other: No abdominal wall hernia.  Small amount of pelvic free fluid. Musculoskeletal: No acute or significant osseous findings. IMPRESSION: 1. Fluid-filled nondistended loops of small bowel and colon as can be seen  with enterocolitis. 2. Otherwise, no acute abdominal or pelvic pathology. Electronically Signed   By: Elige KoHetal  Patel   On: 10/11/2018 14:34    Procedures Procedures (including critical care time)  Medications Ordered in ED Medications  sodium chloride 0.9 % bolus 1,000 mL (0 mLs Intravenous Stopped 10/11/18 1337)  pantoprazole (PROTONIX) injection 40 mg (40 mg Intravenous Given 10/11/18 1233)  HYDROmorphone (DILAUDID) injection 1 mg (1 mg Intravenous Given 10/11/18 1233)  ondansetron (ZOFRAN) injection 4 mg (4 mg Intravenous Given 10/11/18 1233)  iohexol (OMNIPAQUE) 300 MG/ML solution 100 mL (100 mLs Intravenous Contrast Given 10/11/18 1407)  haloperidol lactate (HALDOL) injection 2 mg (2 mg Intravenous Given 10/11/18 1436)  morphine 4  MG/ML injection 6 mg (4 mg Intravenous Given 10/11/18 1436)     Initial Impression / Assessment and Plan / ED Course  I have reviewed the triage vital signs and the nursing notes.  Pertinent labs & imaging results that were available during my care of the patient were reviewed by me and considered in my medical decision making (see chart for details).    28 year old male with abdominal pain and nausea/vomiting/diarrhea.  Imaging showing enterocolitis.  Symptoms and exam are consistent with this.  Tachycardia resolved with medicine and IV fluids.  Symptoms now much improved.  Is tolerating p.o.  I feel he is appropriate for continued outpatient symptomatic management.  Return precautions were discussed.  Outpatient follow-up otherwise.  Final Clinical Impressions(s) / ED Diagnoses   Final diagnoses:  Enterocolitis    ED Discharge Orders         Ordered    ondansetron (ZOFRAN) 4 MG tablet  Every 6 hours     10/11/18 1542    dicyclomine (BENTYL) 20 MG tablet  Every 6 hours PRN     10/11/18 1542           Raeford Razor, MD 10/18/18 [phone]

## 2020-06-21 ENCOUNTER — Ambulatory Visit
Admission: EM | Admit: 2020-06-21 | Discharge: 2020-06-21 | Disposition: A | Discharge status: Home / Self Care | Payer: Self-pay | Attending: Emergency Medicine | Admitting: Emergency Medicine

## 2020-06-21 ENCOUNTER — Other Ambulatory Visit: Payer: Self-pay

## 2020-06-21 DIAGNOSIS — Z1152 Encounter for screening for COVID-19: Secondary | ICD-10-CM

## 2020-06-21 DIAGNOSIS — M25512 Pain in left shoulder: Secondary | ICD-10-CM

## 2020-06-21 MED ORDER — MELOXICAM 15 MG PO TABS: 15.0000 mg | ORAL_TABLET | Freq: Every day | ORAL | 0 refills | Status: AC

## 2020-06-21 MED ORDER — CYCLOBENZAPRINE HCL 10 MG PO TABS: 10.0000 mg | ORAL_TABLET | Freq: Every day | ORAL | 0 refills | Status: AC

## 2020-06-21 NOTE — ED Provider Notes (Signed)
[facility]   [id number] 06/21/20 Arrival Time: 1846  CC: LT shoulder pain  SUBJECTIVE: History from: patient. Mark Andersen is a 29 y.o. male complains of LT shoulder pain x 3 months.  Denies a precipitating event or specific injury.  However, does landscaping for a living and admits to a lot of repetitive activities with LT shoulder.  Localizes the pain to the front of shoulder.  Describes the pain as intermittent.  Has NOT tried OTC medications.  Symptoms are made worse with ROM about the shoulder.  Denies similar symptoms in the past.  Denies fever, chills, erythema, ecchymosis, effusion, weakness, numbness and tingling.    Also requests covid test  ROS: As per HPI.  All other pertinent ROS negative.     Past Medical History:  Diagnosis Date  . Chronic chest wall pain   . Pneumomediastinum (HCC) 2011  . Pneumothorax 2011   bilateral   History reviewed. No pertinent surgical history. No Known Allergies No current facility-administered medications on file prior to encounter.   Current Outpatient Medications on File Prior to Encounter  Medication Sig Dispense Refill  . bismuth subsalicylate (PEPTO BISMOL) 262 MG/15ML suspension Take 60 mLs by mouth every 6 (six) hours as needed for indigestion.    . dicyclomine (BENTYL) 20 MG tablet Take 1 tablet (20 mg total) by mouth every 6 (six) hours as needed for spasms. 12 tablet 0  . ibuprofen (ADVIL,MOTRIN) 800 MG tablet Take 1 tablet (800 mg total) by mouth 3 (three) times daily. (Patient taking differently: Take 200 mg by mouth 3 (three) times daily. ) 21 tablet 0  . Multiple Vitamin (ONE-A-DAY ESSENTIAL PO) Take 1 Dose by mouth daily.    . ondansetron (ZOFRAN) 4 MG tablet Take 1 tablet (4 mg total) by mouth every 6 (six) hours. 12 tablet 0   Social History   Socioeconomic History  . Marital status: Single    Spouse name: Not on file  . Number of children: Not on file  . Years of education: Not on file  . Highest  education level: Not on file  Occupational History  . Not on file  Tobacco Use  . Smoking status: Current Every Day Smoker    Packs/day: 0.50    Years: 5.00    Pack years: 2.50    Types: Cigarettes  . Smokeless tobacco: Never Used  Vaping Use  . Vaping Use: Some days  . Substances: CBD  Substance and Sexual Activity  . Alcohol use: Yes    Alcohol/week: 10.0 standard drinks    Types: 10 Cans of beer per week  . Drug use: Never  . Sexual activity: Not on file  Other Topics Concern  . Not on file  Social History Narrative  . Not on file   Social Determinants of Health   Financial Resource Strain:   . Difficulty of Paying Living Expenses: Not on file  Food Insecurity:   . Worried About Programme researcher, broadcasting/film/video in the Last Year: Not on file  . Ran Out of Food in the Last Year: Not on file  Transportation Needs:   . Lack of Transportation (Medical): Not on file  . Lack of Transportation (Non-Medical): Not on file  Physical Activity:   . Days of Exercise per Week: Not on file  . Minutes of Exercise per Session: Not on file  Stress:   . Feeling of Stress : Not on file  Social Connections:   . Frequency of Communication with Friends  and Family: Not on file  . Frequency of Social Gatherings with Friends and Family: Not on file  . Attends Religious Services: Not on file  . Active Member of Clubs or Organizations: Not on file  . Attends Banker Meetings: Not on file  . Marital Status: Not on file  Intimate Partner Violence:   . Fear of Current or Ex-Partner: Not on file  . Emotionally Abused: Not on file  . Physically Abused: Not on file  . Sexually Abused: Not on file   Family History  Problem Relation Age of Onset  . Healthy Mother   . Diabetes Father     OBJECTIVE:  Vitals:   06/21/20 1855  BP: 140/87  Pulse: 83  Resp: 18  Temp: 98.4 F (36.9 C)  SpO2: 96%    General appearance: ALERT; in no acute distress.  Head: NCAT Lungs: Normal respiratory  effort Musculoskeletal: LT shoulder Inspection: Skin warm, dry, clear and intact without obvious erythema, effusion, or ecchymosis.  Palpation: diffusely TTP over anterior and superior aspect of LT trapezius ROM: FROM active and passive Strength: 5/5 shld abduction, 5/5 shld adduction, 5/5 elbow flexion, 5/5 elbow extension, 5/5 grip strength Skin: warm and dry Neurologic: Ambulates without difficulty Psychological: alert and cooperative; normal mood and affect   ASSESSMENT & PLAN:  1. Encounter for screening for COVID-19   2. Acute pain of left shoulder    Meds ordered this encounter  Medications  . meloxicam (MOBIC) 15 MG tablet    Sig: Take 1 tablet (15 mg total) by mouth daily.    Dispense:  30 tablet    Refill:  0    Order Specific Question:   Supervising Provider    Answer:   Eustace Moore [[id number]]  . cyclobenzaprine (FLEXERIL) 10 MG tablet    Sig: Take 1 tablet (10 mg total) by mouth at bedtime.    Dispense:  15 tablet    Refill:  0    Order Specific Question:   Supervising Provider    Answer:   Eustace Moore [[id number]]    Continue conservative management of rest, ice, and gentle stretches Take mobic as needed for pain relief (may cause abdominal discomfort, ulcers, and GI bleeds avoid taking with other NSAIDs) Take cyclobenzaprine at nighttime for symptomatic relief. Avoid driving or operating heavy machinery while using medication. Follow up with orthopedist Return or go to the ER if you have any new or worsening symptoms (fever, chills, chest pain, redness, swelling, etc...)   Reviewed expectations re: course of current medical issues. Questions answered. Outlined signs and symptoms indicating need for more acute intervention. Patient verbalized understanding. After Visit Summary given.    [name], PA-C 06/21/20 1908

## 2020-06-21 NOTE — Discharge Instructions (Signed)
Continue conservative management of rest, ice, and gentle stretches Take mobic as needed for pain relief (may cause abdominal discomfort, ulcers, and GI bleeds avoid taking with other NSAIDs) Take cyclobenzaprine at nighttime for symptomatic relief. Avoid driving or operating heavy machinery while using medication. Follow up with orthopedist Return or go to the ER if you have any new or worsening symptoms (fever, chills, chest pain, redness, swelling, etc...)

## 2020-06-21 NOTE — ED Triage Notes (Signed)
Pt presents with c/o left shoulder pain that first began 3 months ago and became worse recently, also wants covid test

## 2020-06-22 LAB — NOVEL CORONAVIRUS, NAA: SARS-CoV-2, NAA: NOT DETECTED

## 2020-06-22 LAB — SARS-COV-2, NAA 2 DAY TAT

## 2020-09-23 ENCOUNTER — Other Ambulatory Visit: Payer: Self-pay

## 2020-09-23 DIAGNOSIS — Z20822 Contact with and (suspected) exposure to covid-19: Secondary | ICD-10-CM

## 2020-09-24 LAB — NOVEL CORONAVIRUS, NAA: SARS-CoV-2, NAA: NOT DETECTED

## 2020-09-24 LAB — SARS-COV-2, NAA 2 DAY TAT

## 2020-10-03 ENCOUNTER — Ambulatory Visit: Payer: Self-pay | Admitting: Internal Medicine

## 2021-09-13 ENCOUNTER — Emergency Department (HOSPITAL_COMMUNITY)
Admission: EM | Admit: 2021-09-13 | Discharge: 2021-09-13 | Disposition: A | Discharge status: Home / Self Care | Payer: Self-pay | Attending: Emergency Medicine | Admitting: Emergency Medicine

## 2021-09-13 ENCOUNTER — Other Ambulatory Visit: Payer: Self-pay

## 2021-09-13 ENCOUNTER — Emergency Department (HOSPITAL_COMMUNITY): Payer: Self-pay

## 2021-09-13 ENCOUNTER — Encounter (HOSPITAL_COMMUNITY): Payer: Self-pay | Admitting: *Deleted

## 2021-09-13 DIAGNOSIS — Y92002 Bathroom of unspecified non-institutional (private) residence single-family (private) house as the place of occurrence of the external cause: Secondary | ICD-10-CM | POA: Insufficient documentation

## 2021-09-13 DIAGNOSIS — R55 Syncope and collapse: Secondary | ICD-10-CM | POA: Insufficient documentation

## 2021-09-13 DIAGNOSIS — W182XXA Fall in (into) shower or empty bathtub, initial encounter: Secondary | ICD-10-CM | POA: Insufficient documentation

## 2021-09-13 DIAGNOSIS — S060XAA Concussion with loss of consciousness status unknown, initial encounter: Secondary | ICD-10-CM | POA: Insufficient documentation

## 2021-09-13 DIAGNOSIS — S060X1A Concussion with loss of consciousness of 30 minutes or less, initial encounter: Secondary | ICD-10-CM

## 2021-09-13 LAB — CBC
HCT: 42.1 % (ref 39.0–52.0)
Hemoglobin: 14.6 g/dL (ref 13.0–17.0)
MCH: 32.7 pg (ref 26.0–34.0)
MCHC: 34.7 g/dL (ref 30.0–36.0)
MCV: 94.2 fL (ref 80.0–100.0)
Platelets: 208 10*3/uL (ref 150–400)
RBC: 4.47 MIL/uL (ref 4.22–5.81)
RDW: 12.2 % (ref 11.5–15.5)
WBC: 4.6 10*3/uL (ref 4.0–10.5)
nRBC: 0 % (ref 0.0–0.2)

## 2021-09-13 LAB — BASIC METABOLIC PANEL
Anion gap: 7 (ref 5–15)
BUN: 10 mg/dL (ref 6–20)
CO2: 28 mmol/L (ref 22–32)
Calcium: 9.1 mg/dL (ref 8.9–10.3)
Chloride: 102 mmol/L (ref 98–111)
Creatinine, Ser: 0.9 mg/dL (ref 0.61–1.24)
GFR, Estimated: >60 mL/min (ref >60)
Glucose, Bld: 85 mg/dL (ref 70–99)
Potassium: 3.9 mmol/L (ref 3.5–5.1)
Sodium: 137 mmol/L (ref 135–145)

## 2021-09-13 NOTE — ED Notes (Signed)
Pt returned from CT °

## 2021-09-13 NOTE — Discharge Instructions (Signed)
You likely have a mild concussion.  I recommend that you avoid bending over and heavy lifting for 5 to 7 days.  Please take ibuprofen, 600-800 mg 3 times a day with food for headache.  Get plenty of rest.  Follow-up with your primary care provider for recheck.  Return to the emergency department for any new or worsening symptoms.

## 2021-09-13 NOTE — ED Triage Notes (Addendum)
Pt c/o syncopal episode a couple days ago while standing using the restroom. Pt reports he felt fine and then all of sudden "blacked out". Pt reports he hit the left side of his head. Has been having dizziness since the fall.

## 2021-09-13 NOTE — ED Notes (Addendum)
Patient transported to CT 

## 2021-09-15 NOTE — ED Provider Notes (Signed)
[facility] EMERGENCY DEPARTMENT Provider Note   CSN: [id number] Arrival date & time: 09/13/21  1308     History  Chief Complaint  Patient presents with   Loss of Consciousness    Mark Andersen is a 31 y.o. male.   Loss of Consciousness Associated symptoms: dizziness   Associated symptoms: no confusion, no fever, no nausea, no seizures, no shortness of breath, no vomiting and no weakness        Mark Andersen is a 31 y.o. male who presents to the Emergency Department complaining of having a syncopal episode 2 days prior to ER arrival.  States he got up quickly from bed to use the restroom and "passed out" while urinating.  Struck the left side of his head on the tub.  Significant other states she heard a loud noise and found him on the bathroom floor, no seizure activity and his eyes were open.  She states episode lasted 1-2 minutes at most.  He reports having dizziness and "soreness" of the left side of his head.  He denies neck pain, visual changes and vomiting.  Denies other injuries related to the fall.    Home Medications Prior to Admission medications   Medication Sig Start Date End Date Taking? Authorizing Provider  bismuth subsalicylate (PEPTO BISMOL) 262 MG/15ML suspension Take 60 mLs by mouth every 6 (six) hours as needed for indigestion.    [provider]  cyclobenzaprine (FLEXERIL) 10 MG tablet Take 1 tablet (10 mg total) by mouth at bedtime. 06/21/20   Wurst, [place], PA-C  dicyclomine (BENTYL) 20 MG tablet Take 1 tablet (20 mg total) by mouth every 6 (six) hours as needed for spasms. 10/11/18   Virgel Manifold, MD  ibuprofen (ADVIL,MOTRIN) 800 MG tablet Take 1 tablet (800 mg total) by mouth 3 (three) times daily. Patient taking differently: Take 200 mg by mouth 3 (three) times daily.  01/14/18   Noemi Chapel, MD  meloxicam (MOBIC) 15 MG tablet Take 1 tablet (15 mg total) by mouth daily. 06/21/20   Wurst, [place], PA-C  Multiple Vitamin (ONE-A-DAY ESSENTIAL PO)  Take 1 Dose by mouth daily.    [provider]  ondansetron (ZOFRAN) 4 MG tablet Take 1 tablet (4 mg total) by mouth every 6 (six) hours. 10/11/18   Virgel Manifold, MD      Allergies    Patient has no known allergies.    Review of Systems   Review of Systems  Constitutional:  Negative for appetite change and fever.  HENT:  Negative for nosebleeds.   Eyes:  Negative for visual disturbance.  Respiratory:  Negative for shortness of breath.   Cardiovascular:  Positive for syncope.  Gastrointestinal:  Negative for nausea and vomiting.  Musculoskeletal:  Negative for arthralgias.  Neurological:  Positive for dizziness and syncope. Negative for seizures, facial asymmetry, weakness, light-headedness and numbness.  Psychiatric/Behavioral:  Negative for confusion.   All other systems reviewed and are negative.  Physical Exam Updated Vital Signs BP 124/78 (BP Location: Right Arm)    Pulse 77    Temp 98 F (36.7 C) (Oral)    Resp 18    Ht 6\' 2"  (1.88 m)    Wt 68 kg    SpO2 97%    BMI 19.26 kg/m  Physical Exam Vitals and nursing note reviewed.  Constitutional:      General: He is not in acute distress.    Appearance: Normal appearance.  HENT:     Head:  Comments: Diffuse ttp of the left parietal scalp.  No abrasions or hematoma.     Right Ear: Tympanic membrane and ear canal normal.     Left Ear: Tympanic membrane and ear canal normal.     Nose: Nose normal.     Mouth/Throat:     Mouth: Mucous membranes are moist.  Eyes:     Extraocular Movements: Extraocular movements intact.     Conjunctiva/sclera: Conjunctivae normal.     Pupils: Pupils are equal, round, and reactive to light.  Cardiovascular:     Rate and Rhythm: Normal rate and regular rhythm.     Pulses: Normal pulses.  Pulmonary:     Effort: Pulmonary effort is normal.  Chest:     Chest wall: No tenderness.  Abdominal:     Palpations: Abdomen is soft.     Tenderness: There is no abdominal tenderness.   Musculoskeletal:        General: Normal range of motion.     Cervical back: Normal range of motion. No tenderness.  Skin:    General: Skin is warm.  Neurological:     General: No focal deficit present.     Mental Status: He is alert.     Sensory: No sensory deficit.     Motor: No weakness.  Psychiatric:        Mood and Affect: Mood normal.    ED Results / Procedures / Treatments   Labs (all labs ordered are listed, but only abnormal results are displayed) Labs Reviewed  BASIC METABOLIC PANEL  CBC    EKG EKG Interpretation  Date/Time:  Wednesday September 13 2021 13:30:59 EST Ventricular Rate:  94 PR Interval:  146 QRS Duration: 90 QT Interval:  314 QTC Calculation: 392 R Axis:   104 Text Interpretation: Normal sinus rhythm Right atrial enlargement Rightward axis Pulmonary disease pattern Abnormal ECG Confirmed by Godfrey Pick ([id number]) on 09/13/2021 4:06:59 PM  Radiology CT Head Wo Contrast  Result Date: 09/13/2021 CLINICAL DATA:  Dizziness with left-sided headache after loss of consciousness and falling 2 days ago. Left-sided head injury. EXAM: CT HEAD WITHOUT CONTRAST TECHNIQUE: Contiguous axial images were obtained from the base of the skull through the vertex without intravenous contrast. RADIATION DOSE REDUCTION: This exam was performed according to the departmental dose-optimization program which includes automated exposure control, adjustment of the mA and/or kV according to patient size and/or use of iterative reconstruction technique. COMPARISON:  None. FINDINGS: Brain: There is no evidence of acute intracranial hemorrhage, mass lesion, brain edema or extra-axial fluid collection. The ventricles and subarachnoid spaces are appropriately sized for age. There is no CT evidence of acute cortical infarction. Vascular:  No hyperdense vessel identified. Skull: Negative for fracture or focal lesion. Sinuses/Orbits: The visualized paranasal sinuses and mastoid air cells are clear. No  orbital abnormalities are seen. Other: None. IMPRESSION: Unremarkable noncontrast head CT.  No acute posttraumatic findings. Electronically Signed   By: Richardean Sale M.D.   On: 09/13/2021 15:57     Procedures Procedures    Medications Ordered in ED Medications - No data to display  ED Course/ Medical Decision Making/ A&P                           Medical Decision Making Pt here for eval after a brief episode of syncope after waking up to go to the bathroom.  No hx of prior syncope, heart disease, seizures.  Struck his head on bathtub.  Ddx includes subdural hematoma, concussion, intracranial injury  On exam, pt well appearing, ambulatory with steady gait.  No focal neuro deficits.  No hematoma.    Amount and/or Complexity of Data Reviewed Independent Historian:     Details: no seizure activity witnessed by signifiacnt other. Labs: ordered.    Details: labs intrepreted by me show no leukytosis, chemtries are unremarkable. Radiology: ordered.    Details: CT head w/o acute intercranial injury.  I agree with radiology intrepretation.   Pt likely experience vasovagal syncope.  He did strike his head as he fell so he likely has a concussion as well.  Work up reassuring. No EKG findings to show concern.   I feel that he is appropriate for d/c home.  Discussed concussion precautions and need for close out patient f/u  he agrees to plan.  Return precautions also given.          Final Clinical Impression(s) / ED Diagnoses Final diagnoses:  Concussion with brief LOC    Rx / DC Orders ED Discharge Orders     None         Kem Parkinson, PA-C 09/15/21 2354    Daleen Bo, MD 09/16/21 2300

## 2023-10-31 IMAGING — CT CT HEAD W/O CM
3 series · 15 of 47 positions shown, 18 images · non-contrast
Comparison: None.

CLINICAL DATA: Dizziness with left-sided headache after loss of
consciousness and falling 2 days ago. Left-sided head injury.



[Series 2: head w o · axial · 0.41mm/px · z∈[+221,+346]mm · 9 of 31 slices shown, 12 images]
[im 3/31  brain]
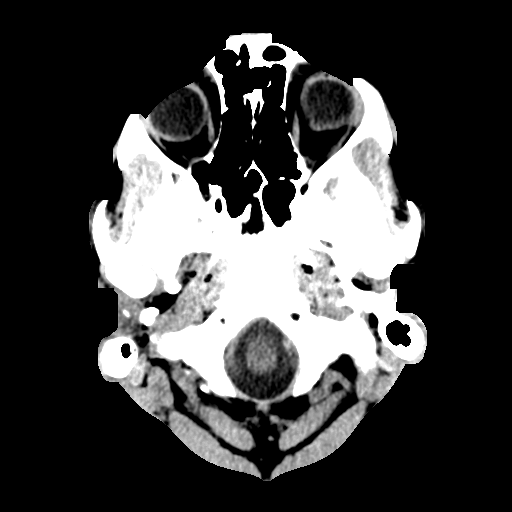
[im 3/31  bone]
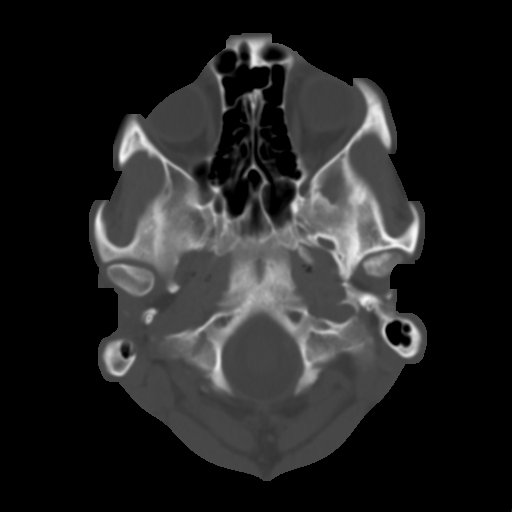
[im 6/31  brain]
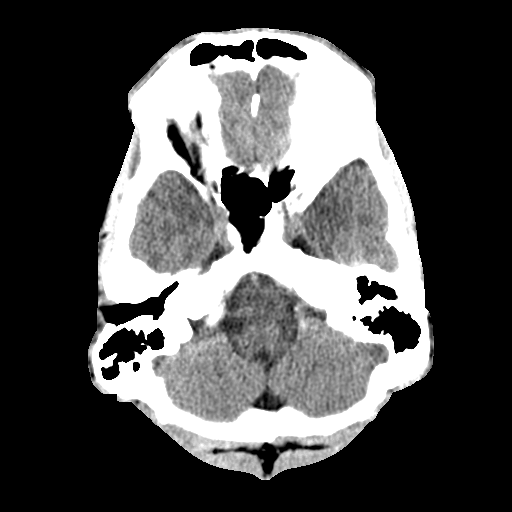
[im 9/31  brain]
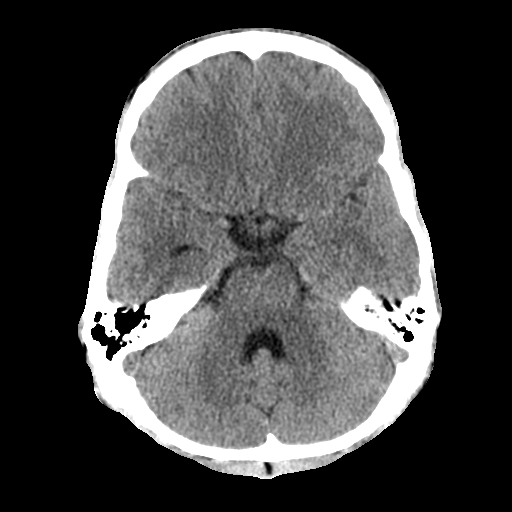
[im 12/31  brain]
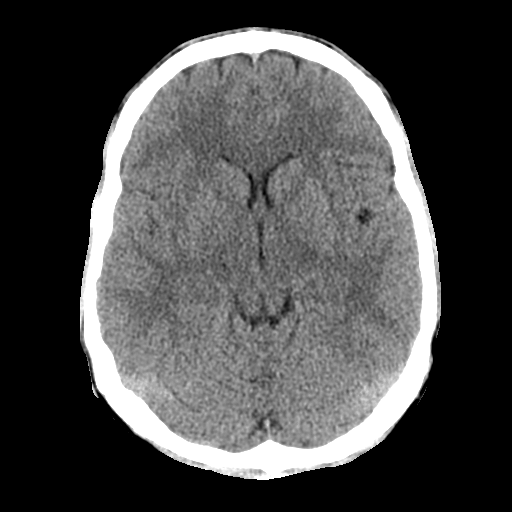
[im 16/31  brain]
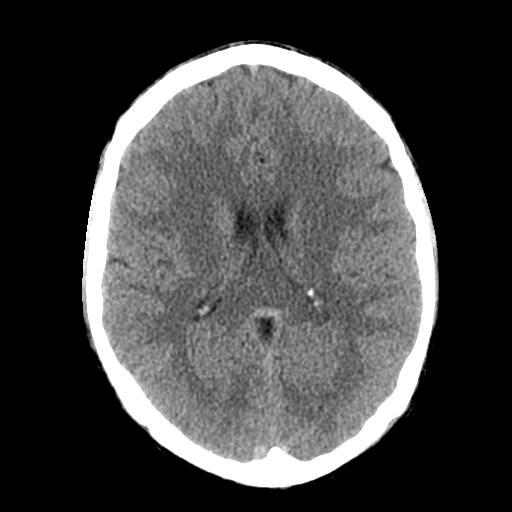
[im 16/31  bone]
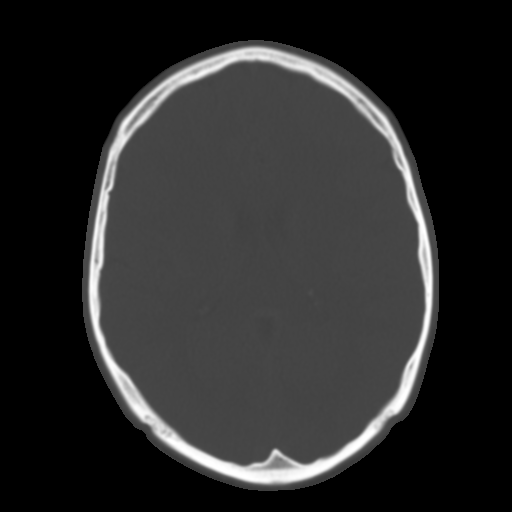
[im 19/31  brain]
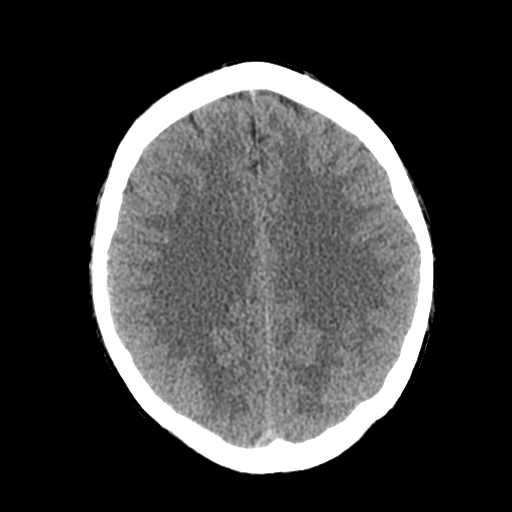
[im 22/31  brain]
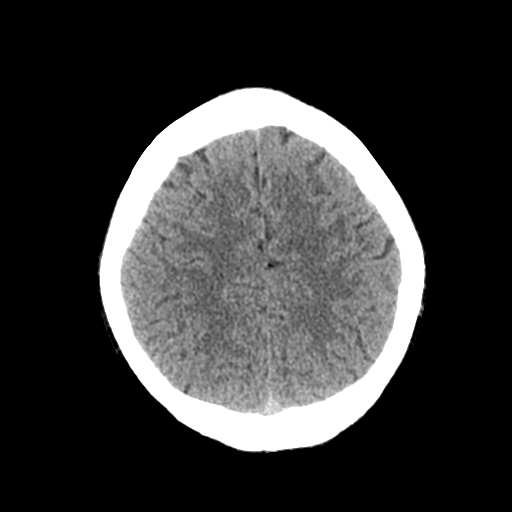
[im 25/31  brain]
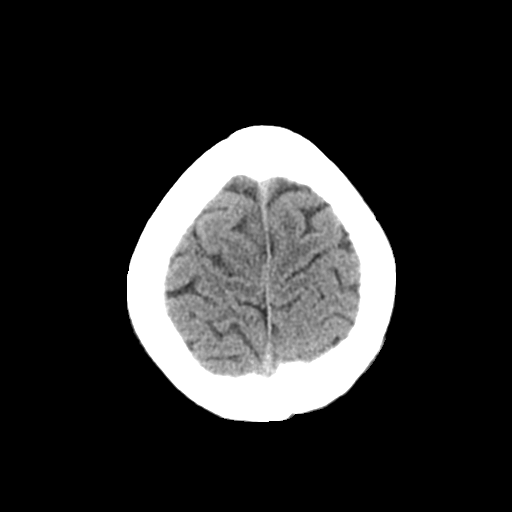
[im 28/31  brain]
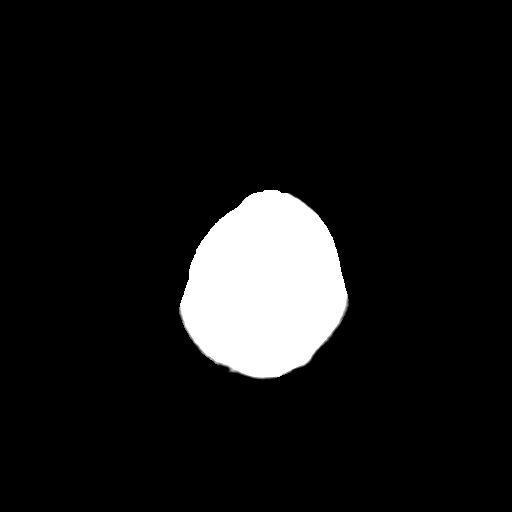
[im 28/31  bone]
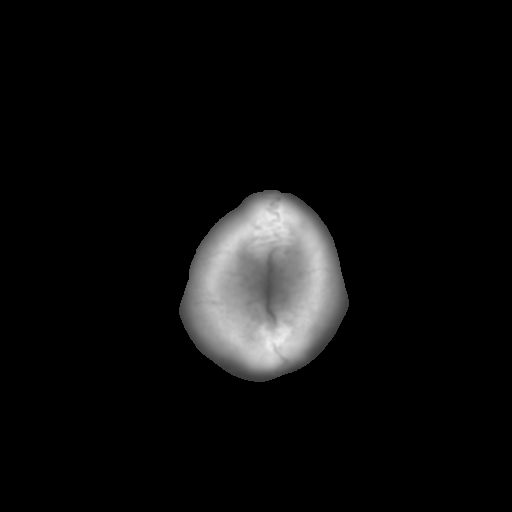

[Series 4: coronal soft · coronal · 0.31mm/px · 3 of 75 slices shown]
[im 25/75  brain]
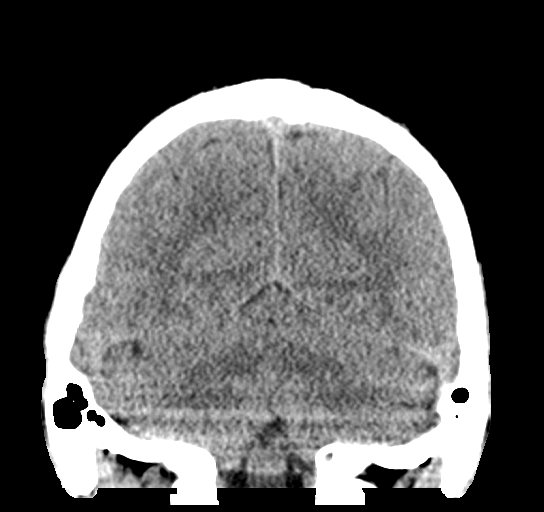
[im 33/75  brain]
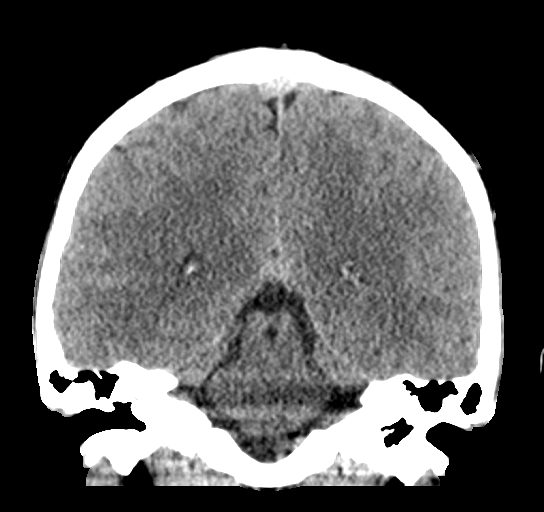
[im 42/75  brain]
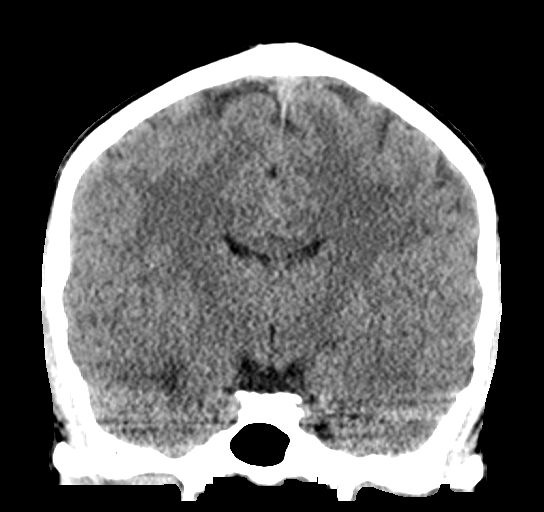

[Series 5: sagittal soft · sagittal · 0.33mm/px · 3 of 65 slices shown]
[im 22/65  brain]
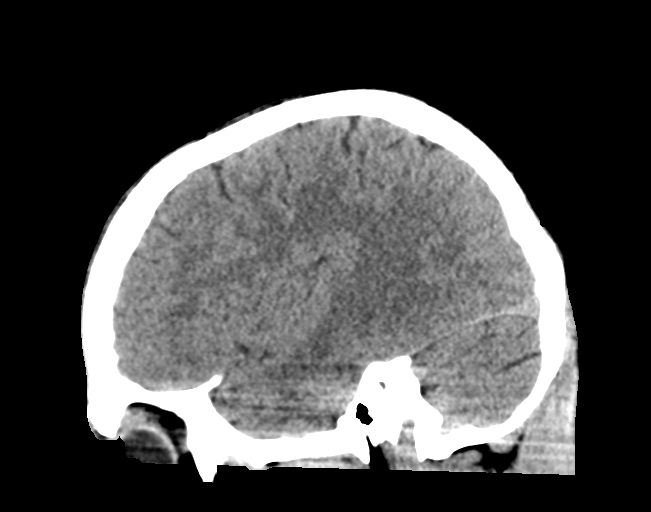
[im 33/65  brain]
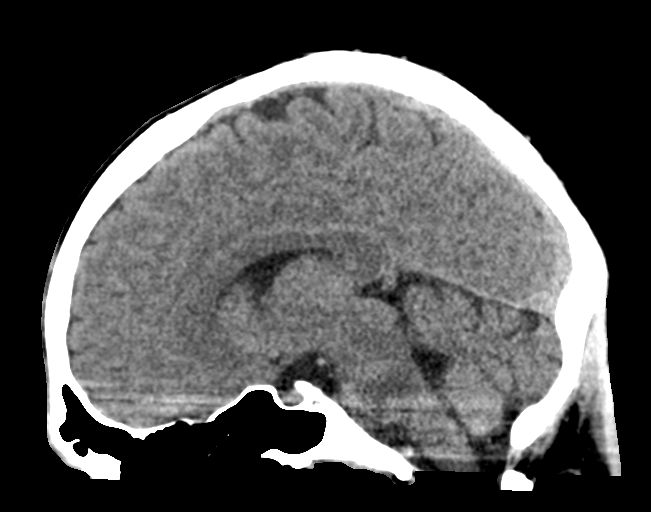
[im 43/65  brain]
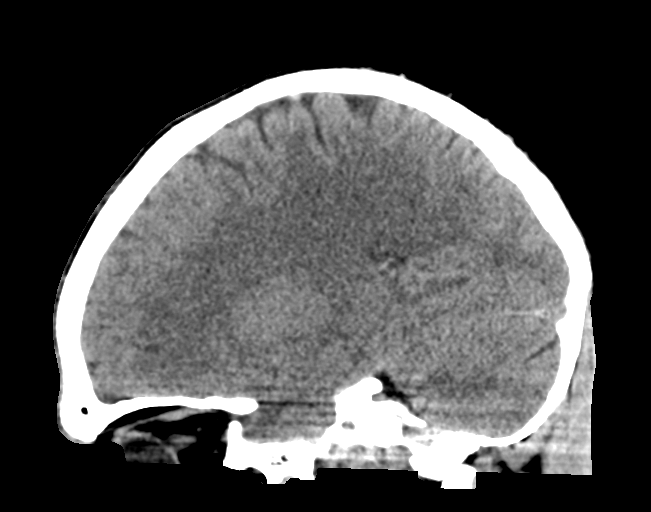

[15 of 47 positions shown; findings below may reference images not displayed]

FINDINGS: Brain: There is no evidence of acute intracranial hemorrhage, mass
lesion, brain edema or extra-axial fluid collection. The ventricles
and subarachnoid spaces are appropriately sized for age. There is no
CT evidence of acute cortical infarction.

Vascular:  No hyperdense vessel identified.

Skull: Negative for fracture or focal lesion.

Sinuses/Orbits: The visualized paranasal sinuses and mastoid air
cells are clear. No orbital abnormalities are seen.

Other: None.
IMPRESSION: Unremarkable noncontrast head CT.  No acute posttraumatic findings.
